# Patient Record
Sex: Male | Born: 1990 | Race: Black or African American | Hispanic: No | Marital: Single | Smoking: Never smoker
Health system: Southern US, Community
[De-identification: ages and names within clinical notes are randomized; demographics above are authoritative.]

---
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
## 2016-10-18 ENCOUNTER — Ambulatory Visit (HOSPITAL_COMMUNITY)
Admission: EM | Admit: 2016-10-18 | Discharge: 2016-10-18 | Disposition: A | Discharge status: Home / Self Care | Payer: BLUE CROSS/BLUE SHIELD | Attending: Family Medicine | Admitting: Family Medicine

## 2016-10-18 ENCOUNTER — Encounter (HOSPITAL_COMMUNITY): Payer: Self-pay | Admitting: *Deleted

## 2016-10-18 DIAGNOSIS — L309 Dermatitis, unspecified: Secondary | ICD-10-CM

## 2016-10-18 MED ORDER — TRIAMCINOLONE ACETONIDE 0.1 % EX CREA: 1.0000 "application " | TOPICAL_CREAM | Freq: Two times a day (BID) | CUTANEOUS | 1 refills | Status: DC

## 2016-10-18 NOTE — ED Triage Notes (Signed)
Pt  Has   An  Area  Of  Discoloration   r  Side  Of  Neck      That he reports  He  Has  Had  For  Almost  1  Year    He   Displays  No  Angioedema   And  Appears  In no  Acute  Distress

## 2016-10-18 NOTE — ED Provider Notes (Signed)
CSN: [id number]     Arrival date & time 10/18/16  1315 History   None    Chief Complaint  Patient presents with  . Rash   (Consider location/radiation/quality/duration/timing/severity/associated sxs/prior Treatment) Patient c/o rash on right side of neck.  He states he has hx of eczema.   The history is provided by the patient.  Rash  Location:  Head/neck Quality: itchiness and redness   Severity:  Mild Onset quality:  Sudden Timing:  Constant Progression:  Worsening Chronicity:  New   History reviewed. No pertinent past medical history. History reviewed. No pertinent surgical history. History reviewed. No pertinent family history. Social History  Substance Use Topics  . Smoking status: Never Smoker  . Smokeless tobacco: Never Used  . Alcohol use No    Review of Systems  Constitutional: Negative.   HENT: Negative.   Eyes: Negative.   Respiratory: Negative.   Cardiovascular: Negative.   Gastrointestinal: Negative.   Endocrine: Negative.   Genitourinary: Negative.   Skin: Positive for rash.  Allergic/Immunologic: Negative.   Neurological: Negative.   Hematological: Negative.   Psychiatric/Behavioral: Negative.     Allergies  Patient has no known allergies.  Home Medications   Prior to Admission medications   Medication Sig Start Date End Date Taking? Authorizing Provider  triamcinolone cream (KENALOG) 0.1 % Apply 1 application topically 2 (two) times daily. 10/18/16   Deatra CanterWilliam J Sharonda Llamas, FNP   Meds Ordered and Administered this Visit  Medications - No data to display  BP 110/70 (BP Location: Right Arm)   Pulse 78   Temp 98.6 F (37 C) (Oral)   Resp 18   SpO2 100%  No data found.   Physical Exam  Constitutional: He is oriented to person, place, and time. He appears well-developed and well-nourished.  HENT:  Head: Normocephalic and atraumatic.  Eyes: Conjunctivae and EOM are normal. Pupils are equal, round, and reactive to light.  Neck: Normal range of  motion. Neck supple.  Cardiovascular: Normal rate, regular rhythm and normal heart sounds.   Pulmonary/Chest: Effort normal and breath sounds normal.  Abdominal: Soft.  Neurological: He is alert and oriented to person, place, and time.  Skin: Rash noted.  Rash right neck  Nursing note and vitals reviewed.   Urgent Care Course     Procedures (including critical care time)  Labs Review Labs Reviewed - No data to display  Imaging Review No results found.   Visual Acuity Review  Right Eye Distance:   Left Eye Distance:   Bilateral Distance:    Right Eye Near:   Left Eye Near:    Bilateral Near:         MDM   1. Eczema, unspecified type    Triamcinolone Cream bid #30grams w/1rf     Deatra CanterWilliam J Wess Baney, FNP 10/18/16 1417

## 2016-11-18 ENCOUNTER — Encounter (HOSPITAL_COMMUNITY): Payer: Self-pay | Admitting: *Deleted

## 2016-11-18 ENCOUNTER — Emergency Department (HOSPITAL_COMMUNITY)
Admission: EM | Admit: 2016-11-18 | Discharge: 2016-11-18 | Disposition: A | Discharge status: Home / Self Care | Payer: BLUE CROSS/BLUE SHIELD | Attending: Dermatology | Admitting: Dermatology

## 2016-11-18 DIAGNOSIS — Z202 Contact with and (suspected) exposure to infections with a predominantly sexual mode of transmission: Secondary | ICD-10-CM | POA: Insufficient documentation

## 2016-11-18 DIAGNOSIS — Z5321 Procedure and treatment not carried out due to patient leaving prior to being seen by health care provider: Secondary | ICD-10-CM | POA: Insufficient documentation

## 2016-11-18 NOTE — ED Triage Notes (Signed)
To ED for STD check. Pt had unprotected sex last night. Has no STD symptoms. Just wanting to be checked due to exposure

## 2016-11-18 NOTE — ED Notes (Signed)
Patient states he has to be at work at 4:30 and can not wait any longer.  Explained to patient he will have to check in again.  States "I will just come back tomorrow"  Nurse  Notified.

## 2017-12-20 ENCOUNTER — Emergency Department (HOSPITAL_COMMUNITY)
Admission: EM | Admit: 2017-12-20 | Discharge: 2017-12-20 | Disposition: A | Discharge status: Home / Self Care | Payer: No Typology Code available for payment source | Attending: Emergency Medicine | Admitting: Emergency Medicine

## 2017-12-20 ENCOUNTER — Emergency Department (HOSPITAL_COMMUNITY): Payer: No Typology Code available for payment source

## 2017-12-20 ENCOUNTER — Encounter (HOSPITAL_COMMUNITY): Payer: Self-pay

## 2017-12-20 ENCOUNTER — Other Ambulatory Visit: Payer: Self-pay

## 2017-12-20 DIAGNOSIS — S70212A Abrasion, left hip, initial encounter: Secondary | ICD-10-CM | POA: Diagnosis not present

## 2017-12-20 DIAGNOSIS — Y92410 Unspecified street and highway as the place of occurrence of the external cause: Secondary | ICD-10-CM | POA: Insufficient documentation

## 2017-12-20 DIAGNOSIS — Y999 Unspecified external cause status: Secondary | ICD-10-CM | POA: Insufficient documentation

## 2017-12-20 DIAGNOSIS — S40212A Abrasion of left shoulder, initial encounter: Secondary | ICD-10-CM | POA: Insufficient documentation

## 2017-12-20 DIAGNOSIS — Y939 Activity, unspecified: Secondary | ICD-10-CM | POA: Insufficient documentation

## 2017-12-20 DIAGNOSIS — S50312A Abrasion of left elbow, initial encounter: Secondary | ICD-10-CM | POA: Diagnosis not present

## 2017-12-20 DIAGNOSIS — S60512A Abrasion of left hand, initial encounter: Secondary | ICD-10-CM | POA: Diagnosis not present

## 2017-12-20 DIAGNOSIS — T07XXXA Unspecified multiple injuries, initial encounter: Secondary | ICD-10-CM

## 2017-12-20 DIAGNOSIS — S4992XA Unspecified injury of left shoulder and upper arm, initial encounter: Secondary | ICD-10-CM | POA: Diagnosis present

## 2017-12-20 MED ORDER — IBUPROFEN 600 MG PO TABS: 600.0000 mg | ORAL_TABLET | Freq: Four times a day (QID) | ORAL | 0 refills | Status: DC | PRN

## 2017-12-20 MED ORDER — BACITRACIN ZINC 500 UNIT/GM EX OINT
TOPICAL_OINTMENT | Freq: Two times a day (BID) | CUTANEOUS | Status: DC
  Administered 2017-12-20: 15:00:00 via TOPICAL

## 2017-12-20 MED ORDER — CYCLOBENZAPRINE HCL 10 MG PO TABS: 10.0000 mg | ORAL_TABLET | Freq: Two times a day (BID) | ORAL | 0 refills | Status: AC | PRN
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
## 2021-03-30 ENCOUNTER — Other Ambulatory Visit: Payer: Self-pay

## 2021-03-30 ENCOUNTER — Ambulatory Visit (HOSPITAL_COMMUNITY)
Admission: EM | Admit: 2021-03-30 | Discharge: 2021-03-30 | Disposition: A | Discharge status: Home / Self Care | Payer: No Payment, Other

## 2021-03-30 NOTE — BH Assessment (Signed)
TTS triage: Patient presents to [facility] unaccompanied seeking mental health treatment. Patient states he wakes up in a depressed mood and isolates himself. He states no one wants to be around him because he is negative. Patient denies SI/HI/AVH. He reports occasional THC use but none recent. States he has been nauseous. Patient denies any previous mental health treatment.  Patient is routine.

## 2021-03-30 NOTE — Discharge Instructions (Signed)
° °  Please come to [facility] (this facility) during walk in hours for appointment with psychiatrist for further medication management and for therapists for therapy.  ° ° Walk in hours are 8-11 AM Monday through Thursday for medication management.Child and adolescent psychiatrists are only available on Wednesdays and Thursdays during walk in hours.  °Therapy walk in hours are Monday-Wednesday 8 AM-1PM.   It is first come, first -serve; it is best to arrive by 7:00 AM.  ° °On Friday from 1 pm to 4 pm for therapy intake only. Please arrive by 12:00 pm as it is  first come, first -serve.   ° °When you arrive please go upstairs for your appointment. If you are unsure of where to go, inform the front desk that you are here for a walk in appointment and they will assist you with directions upstairs. ° °Address:  °[address], in [place], [address] °Ph: [phone]  ° °

## 2021-03-30 NOTE — ED Provider Notes (Addendum)
Patient decided to leave before being assessed by physician. This physician was able to come up front in time to try and ask patient why he was trying to leave, but reported he "fine now." Patient did deny SI, HI, and AVH on triage. Provider did not see reason to IVC patient and patient made his own decision to leave the premises as he felt he no longer required services.   Providers unfortunately were not able to have enough time to see patient prior to the morning meeting; however patient had been triaged and had his vitals.   PGY-2 Eliseo Gum, MD

## 2021-08-17 ENCOUNTER — Other Ambulatory Visit: Payer: Self-pay

## 2021-08-17 ENCOUNTER — Encounter (HOSPITAL_COMMUNITY): Payer: Self-pay

## 2021-08-17 ENCOUNTER — Ambulatory Visit (HOSPITAL_COMMUNITY)
Admission: EM | Admit: 2021-08-17 | Discharge: 2021-08-17 | Disposition: A | Discharge status: Home / Self Care | Payer: Self-pay | Attending: Internal Medicine | Admitting: Internal Medicine

## 2021-08-17 DIAGNOSIS — J020 Streptococcal pharyngitis: Secondary | ICD-10-CM

## 2021-08-17 LAB — POCT RAPID STREP A, ED / UC: Streptococcus, Group A Screen (Direct): POSITIVE — AB

## 2021-08-17 MED ORDER — IBUPROFEN 600 MG PO TABS: 600.0000 mg | ORAL_TABLET | Freq: Four times a day (QID) | ORAL | 0 refills | Status: AC | PRN

## 2021-08-17 MED ORDER — IBUPROFEN 600 MG PO TABS: 600.0000 mg | ORAL_TABLET | Freq: Four times a day (QID) | ORAL | 0 refills | Status: DC | PRN

## 2021-08-17 MED ORDER — LIDOCAINE VISCOUS HCL 2 % MT SOLN: 15.0000 mL | OROMUCOSAL | 0 refills | Status: AC | PRN

## 2021-08-17 MED ORDER — AMOXICILLIN 500 MG PO CAPS: 500.0000 mg | ORAL_CAPSULE | Freq: Two times a day (BID) | ORAL | 0 refills | Status: AC

## 2021-08-17 NOTE — ED Provider Notes (Signed)
[facility]    CSN: [id number] Arrival date & time: 08/17/21  4098      History   Chief Complaint Chief Complaint  Patient presents with   Sore Throat    HPI George Moyer is a 30 y.o. male to the urgent care with 1 day history of severe throat pain, low-grade fever with chills which started yesterday.  Patient's symptoms started fairly abruptly and has been persistent.  No shortness of breath.  Patient is not vaccinated against COVID.  He was around a lot of people during the Christmas holidays.  He denies generalized body aches.  No headaches.   HPI  History reviewed. No pertinent past medical history.  There are no problems to display for this patient.   History reviewed. No pertinent surgical history.     Home Medications    Prior to Admission medications   Medication Sig Start Date End Date Taking? Authorizing Provider  amoxicillin (AMOXIL) 500 MG capsule Take 1 capsule (500 mg total) by mouth 2 (two) times daily for 10 days. 08/17/21 08/27/21 Yes Emmilee Reamer, Britta Mccreedy, MD  lidocaine (XYLOCAINE) 2 % solution Use as directed 15 mLs in the mouth or throat as needed for mouth pain. 08/17/21  Yes Cassey Bacigalupo, Britta Mccreedy, MD  cyclobenzaprine (FLEXERIL) 10 MG tablet Take 1 tablet (10 mg total) by mouth 2 (two) times daily as needed for muscle spasms. 12/20/17   Fayrene Helper, PA-C  ibuprofen (ADVIL) 600 MG tablet Take 1 tablet (600 mg total) by mouth every 6 (six) hours as needed. 08/17/21   Sherryn Pollino, Britta Mccreedy, MD    Family History Family History  Family history unknown: Yes    Social History Social History   Tobacco Use   Smoking status: Never   Smokeless tobacco: Never  Substance Use Topics   Alcohol use: No     Allergies   Patient has no known allergies.   Review of Systems Review of Systems  Constitutional:  Positive for chills and fever.  HENT:  Positive for sore throat. Negative for congestion.   Respiratory: Negative.    Gastrointestinal:  Negative.   Neurological: Negative.     Physical Exam Triage Vital Signs ED Triage Vitals  Enc Vitals Group     BP      Pulse      Resp      Temp      Temp src      SpO2      Weight      Height      Head Circumference      Peak Flow      Pain Score      Pain Loc      Pain Edu?      Excl. in GC?    No data found.  Updated Vital Signs BP 115/66 (BP Location: Right Arm)    Pulse 80    Temp 99.4 F (37.4 C) (Oral)    Resp 17    SpO2 99%   Visual Acuity Right Eye Distance:   Left Eye Distance:   Bilateral Distance:    Right Eye Near:   Left Eye Near:    Bilateral Near:     Physical Exam Vitals and nursing note reviewed.  Constitutional:      General: He is in acute distress.     Appearance: He is not ill-appearing.  HENT:     Right Ear: Tympanic membrane normal.     Left Ear: Tympanic membrane normal.  Mouth/Throat:     Pharynx: Posterior oropharyngeal erythema present.     Comments: Pharyngeal exudates Cardiovascular:     Rate and Rhythm: Normal rate and regular rhythm.  Pulmonary:     Effort: Pulmonary effort is normal.     Breath sounds: Normal breath sounds.  Neurological:     Mental Status: He is alert.     UC Treatments / Results  Labs (all labs ordered are listed, but only abnormal results are displayed) Labs Reviewed  POCT RAPID STREP A, ED / UC - Abnormal; Notable for the following components:      Result Value   Streptococcus, Group A Screen (Direct) POSITIVE (*)    All other components within normal limits    EKG   Radiology No results found.  Procedures Procedures (including critical care time)  Medications Ordered in UC Medications - No data to display  Initial Impression / Assessment and Plan / UC Course  I have reviewed the triage vital signs and the nursing notes.  Pertinent labs & imaging results that were available during my care of the patient were reviewed by me and considered in my medical decision making (see  chart for details).     1.  Acute streptococcal pharyngitis: Point-of-care strep is positive Amoxicillin 500 mg twice daily for 10 days Lidocaine viscous as needed for throat pain Ibuprofen or Motrin as needed for pain and/or fever Return to urgent care if symptoms worsen. Final Clinical Impressions(s) / UC Diagnoses   Final diagnoses:  Acute streptococcal pharyngitis     Discharge Instructions      Warm salt water gargle Take antibiotics as directed Please take medications as directed Motrin as needed for pain Maintain adequate hydration Return to urgent care if you have any worsening symptoms     ED Prescriptions     Medication Sig Dispense Auth. Provider   ibuprofen (ADVIL) 600 MG tablet  (Status: Discontinued) Take 1 tablet (600 mg total) by mouth every 6 (six) hours as needed. 30 tablet Jocilynn Grade, Britta Mccreedy, MD   amoxicillin (AMOXIL) 500 MG capsule Take 1 capsule (500 mg total) by mouth 2 (two) times daily for 10 days. 20 capsule Bentlee Benningfield, Britta Mccreedy, MD   lidocaine (XYLOCAINE) 2 % solution Use as directed 15 mLs in the mouth or throat as needed for mouth pain. 100 mL Kelby Adell, Britta Mccreedy, MD   ibuprofen (ADVIL) 600 MG tablet Take 1 tablet (600 mg total) by mouth every 6 (six) hours as needed. 30 tablet Masaji Billups, Britta Mccreedy, MD      PDMP not reviewed this encounter.   Merrilee Jansky, MD 08/17/21 [id number]

## 2021-08-17 NOTE — Discharge Instructions (Signed)
Warm salt water gargle Take antibiotics as directed Please take medications as directed Motrin as needed for pain Maintain adequate hydration Return to urgent care if you have any worsening symptoms

## 2021-08-17 NOTE — ED Triage Notes (Signed)
Pt presents with sore throat since yesterday.
